# Patient Record
Sex: Female | Born: 1965 | Race: White | Hispanic: No
Health system: Southern US, Community
[De-identification: ages and names within clinical notes are randomized; demographics above are authoritative.]

---

## 2004-02-10 ENCOUNTER — Emergency Department: Payer: Self-pay | Admitting: General Practice

## 2004-11-03 ENCOUNTER — Emergency Department: Payer: Self-pay | Admitting: Emergency Medicine

## 2005-01-22 ENCOUNTER — Emergency Department: Payer: Self-pay | Admitting: Emergency Medicine

## 2005-04-29 ENCOUNTER — Emergency Department: Payer: Self-pay | Admitting: Emergency Medicine

## 2006-08-24 ENCOUNTER — Emergency Department: Payer: Self-pay | Admitting: Emergency Medicine

## 2011-01-27 ENCOUNTER — Ambulatory Visit: Payer: Self-pay | Admitting: Family Medicine

## 2011-12-30 ENCOUNTER — Emergency Department: Payer: Self-pay | Admitting: Emergency Medicine

## 2011-12-30 LAB — BASIC METABOLIC PANEL
Anion Gap: 8 (ref 7–16)
Calcium, Total: 9.2 mg/dL (ref 8.5–10.1)
Co2: 29 mmol/L (ref 21–32)
EGFR (African American): >60
Osmolality: 267 (ref 275–301)

## 2011-12-30 LAB — CBC
HCT: 42.4 % (ref 35.0–47.0)
MCHC: 35.1 g/dL (ref 32.0–36.0)
MCV: 90 fL (ref 80–100)
Platelet: 254 10*3/uL (ref 150–440)

## 2011-12-30 LAB — TROPONIN I: Troponin-I: <0.02 ng/mL

## 2014-07-06 ENCOUNTER — Other Ambulatory Visit: Payer: Self-pay | Admitting: Family Medicine

## 2014-07-06 DIAGNOSIS — Z1231 Encounter for screening mammogram for malignant neoplasm of breast: Secondary | ICD-10-CM

## 2014-07-20 ENCOUNTER — Ambulatory Visit
Admission: RE | Admit: 2014-07-20 | Discharge: 2014-07-20 | Disposition: A | Discharge status: Home / Self Care | Payer: Self-pay | Source: Ambulatory Visit | Attending: Family Medicine | Admitting: Family Medicine

## 2014-07-20 DIAGNOSIS — Z1231 Encounter for screening mammogram for malignant neoplasm of breast: Secondary | ICD-10-CM | POA: Insufficient documentation

## 2014-07-21 ENCOUNTER — Other Ambulatory Visit: Payer: Self-pay | Admitting: Family Medicine

## 2014-07-21 DIAGNOSIS — R928 Other abnormal and inconclusive findings on diagnostic imaging of breast: Secondary | ICD-10-CM

## 2014-08-03 ENCOUNTER — Ambulatory Visit
Admission: RE | Admit: 2014-08-03 | Discharge: 2014-08-03 | Disposition: A | Discharge status: Home / Self Care | Payer: Self-pay | Source: Ambulatory Visit | Attending: Family Medicine | Admitting: Family Medicine

## 2014-08-03 DIAGNOSIS — R928 Other abnormal and inconclusive findings on diagnostic imaging of breast: Secondary | ICD-10-CM

## 2014-08-03 DIAGNOSIS — N63 Unspecified lump in breast: Secondary | ICD-10-CM | POA: Insufficient documentation

## 2014-09-15 ENCOUNTER — Other Ambulatory Visit: Payer: Self-pay | Admitting: Family Medicine

## 2014-09-15 DIAGNOSIS — R590 Localized enlarged lymph nodes: Secondary | ICD-10-CM

## 2014-09-21 ENCOUNTER — Ambulatory Visit
Admission: RE | Admit: 2014-09-21 | Discharge: 2014-09-21 | Disposition: A | Discharge status: Home / Self Care | Payer: Self-pay | Source: Ambulatory Visit | Attending: Family Medicine | Admitting: Family Medicine

## 2014-09-21 ENCOUNTER — Other Ambulatory Visit: Payer: Self-pay | Admitting: Family Medicine

## 2014-09-21 DIAGNOSIS — R590 Localized enlarged lymph nodes: Secondary | ICD-10-CM

## 2015-08-27 ENCOUNTER — Ambulatory Visit: Payer: Self-pay | Attending: Internal Medicine | Admitting: *Deleted

## 2015-08-27 ENCOUNTER — Encounter: Payer: Self-pay | Admitting: *Deleted

## 2015-08-27 ENCOUNTER — Ambulatory Visit
Admission: RE | Admit: 2015-08-27 | Discharge: 2015-08-27 | Disposition: A | Discharge status: Home / Self Care | Payer: Self-pay | Source: Ambulatory Visit | Attending: Oncology | Admitting: Oncology

## 2015-08-27 ENCOUNTER — Other Ambulatory Visit: Payer: Self-pay | Admitting: *Deleted

## 2015-08-27 VITALS — BP 146/99 | HR 76 | Temp 98.7°F | Ht 63.39 in | Wt 161.5 lb

## 2015-08-27 DIAGNOSIS — N63 Unspecified lump in unspecified breast: Secondary | ICD-10-CM

## 2015-08-27 NOTE — Progress Notes (Signed)
Subjective:     Patient ID: Rhonda Arias, female   DOB: August 06, 1965, 50 y.o.   MRN: [id number]  HPI   Review of Systems     Objective:   Physical Exam  Pulmonary/Chest: Right breast exhibits no inverted nipple, no mass, no nipple discharge, no skin change and no tenderness. Left breast exhibits no inverted nipple, no mass, no nipple discharge and no skin change.         Assessment:     50 year old White female [facility] breast exam and [facility]ogram.  Last mammo was a birads 3.  Patient did not follow-up in 6 months.  States she can still feel the area of concern.  On clinical breast exam I can palpate a tender fibroglandular like area at 4:30-5:30 left breast.  Patient states this is the area of concern on the mammogram.  Blood pressure elevated at 146/99.  She is to recheck her blood pressure at [facility] or [facility], and if remains higher than 140/90 she is to follow-up with her primary care provider.  Hand out on hypertention given to patient. Patient has been screened for eligibility.  She does not have any insurance, Medicare or Medicaid.  She also meets financial eligibility.  Hand-out given on the Affordable Care Act.    Plan:     Bilateral diagnostic mammogram and ultrasound ordered.  Will follow-up per [facility] protocol.

## 2015-08-27 NOTE — Patient Instructions (Signed)
Hypertension Hypertension, commonly called high blood pressure, is when the force of blood pumping through your arteries is too strong. Your arteries are the blood vessels that carry blood from your heart throughout your body. A blood pressure reading consists of a higher number over a lower number, such as 110/72. The higher number (systolic) is the pressure inside your arteries when your heart pumps. The lower number (diastolic) is the pressure inside your arteries when your heart relaxes. Ideally you want your blood pressure below 120/80. Hypertension forces your heart to work harder to pump blood. Your arteries may become narrow or stiff. Having untreated or uncontrolled hypertension can cause heart attack, stroke, kidney disease, and other problems. RISK FACTORS Some risk factors for high blood pressure are controllable. Others are not.  Risk factors you cannot control include:   Race. You may be at higher risk if you are African American.  Age. Risk increases with age.  Gender. Men are at higher risk than women before age 45 years. After age 65, women are at higher risk than men. Risk factors you can control include:  Not getting enough exercise or physical activity.  Being overweight.  Getting too much fat, sugar, calories, or salt in your diet.  Drinking too much alcohol. SIGNS AND SYMPTOMS Hypertension does not usually cause signs or symptoms. Extremely high blood pressure (hypertensive crisis) may cause headache, anxiety, shortness of breath, and nosebleed. DIAGNOSIS To check if you have hypertension, your health care provider will measure your blood pressure while you are seated, with your arm held at the level of your heart. It should be measured at least twice using the same arm. Certain conditions can cause a difference in blood pressure between your right and left arms. A blood pressure reading that is higher than normal on one occasion does not mean that you need treatment. If  it is not clear whether you have high blood pressure, you may be asked to return on a different day to have your blood pressure checked again. Or, you may be asked to monitor your blood pressure at home for 1 or more weeks. TREATMENT Treating high blood pressure includes making lifestyle changes and possibly taking medicine. Living a healthy lifestyle can help lower high blood pressure. You may need to change some of your habits. Lifestyle changes may include:  Following the DASH diet. This diet is high in fruits, vegetables, and whole grains. It is low in salt, red meat, and added sugars.  Keep your sodium intake below 2,300 mg per day.  Getting at least 30-45 minutes of aerobic exercise at least 4 times per week.  Losing weight if necessary.  Not smoking.  Limiting alcoholic beverages.  Learning ways to reduce stress. Your health care provider may prescribe medicine if lifestyle changes are not enough to get your blood pressure under control, and if one of the following is true:  You are 18-59 years of age and your systolic blood pressure is above 140.  You are 60 years of age or older, and your systolic blood pressure is above 150.  Your diastolic blood pressure is above 90.  You have diabetes, and your systolic blood pressure is over 140 or your diastolic blood pressure is over 90.  You have kidney disease and your blood pressure is above 140/90.  You have heart disease and your blood pressure is above 140/90. Your personal target blood pressure may vary depending on your medical conditions, your age, and other factors. HOME CARE INSTRUCTIONS    Have your blood pressure rechecked as directed by your health care provider.   Take medicines only as directed by your health care provider. Follow the directions carefully. Blood pressure medicines must be taken as prescribed. The medicine does not work as well when you skip doses. Skipping doses also puts you at risk for  problems.  Do not smoke.   Monitor your blood pressure at home as directed by your health care provider. SEEK MEDICAL CARE IF:   You think you are having a reaction to medicines taken.  You have recurrent headaches or feel dizzy.  You have swelling in your ankles.  You have trouble with your vision. SEEK IMMEDIATE MEDICAL CARE IF:  You develop a severe headache or confusion.  You have unusual weakness, numbness, or feel faint.  You have severe chest or abdominal pain.  You vomit repeatedly.  You have trouble breathing. MAKE SURE YOU:   Understand these instructions.  Will watch your condition.  Will get help right away if you are not doing well or get worse.   This information is not intended to replace advice given to you by your health care provider. Make sure you discuss any questions you have with your health care provider.   Document Released: 01/27/2005 Document Revised: 06/13/2014 Document Reviewed: 11/19/2012 Elsevier Interactive Patient Education 2016 [employer]   Gave patient hand-out, Women Staying Healthy, Active and Well [facility], with education on breast health, pap smears, heart and colon health.

## 2015-08-28 ENCOUNTER — Encounter: Payer: Self-pay | Admitting: *Deleted

## 2015-08-28 NOTE — Progress Notes (Signed)
Mailed patient a letter with information regarding her mammogram results and need for next mammo and ultrasound in one year.  Appointment scheduled for 09/01/16 @ 8:00.  [facility] [place].

## 2016-07-01 IMAGING — MG MM DIGITAL SCREENING BILAT W/ CAD
4 series · 4 of 4 positions shown · non-contrast
Comparison: Previous exam(s).

CLINICAL DATA: Screening.

EXAM:
DIGITAL SCREENING BILATERAL MAMMOGRAM WITH CAD

[R MLO]
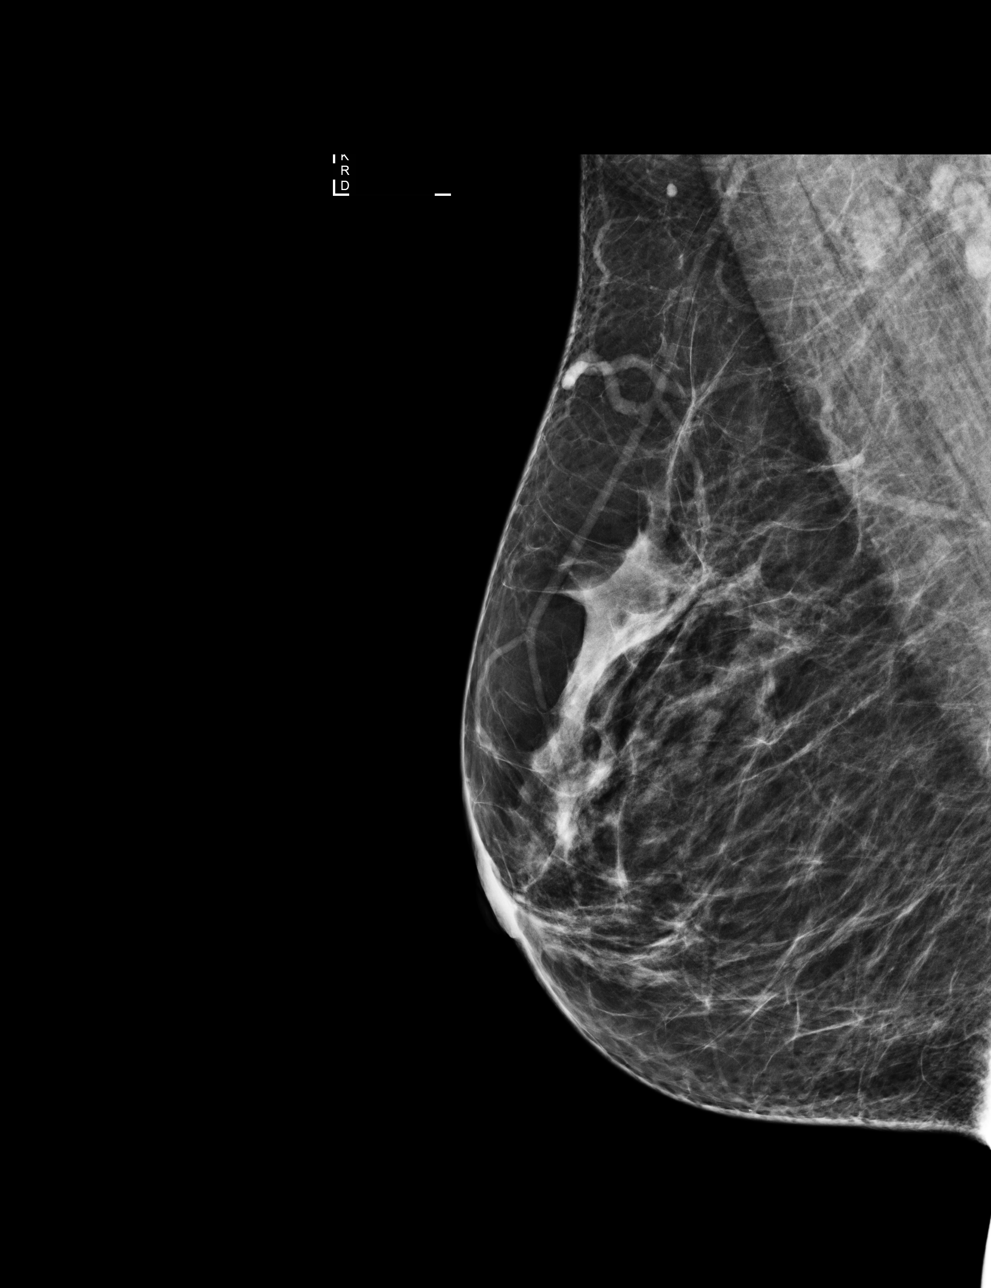

[L MLO]
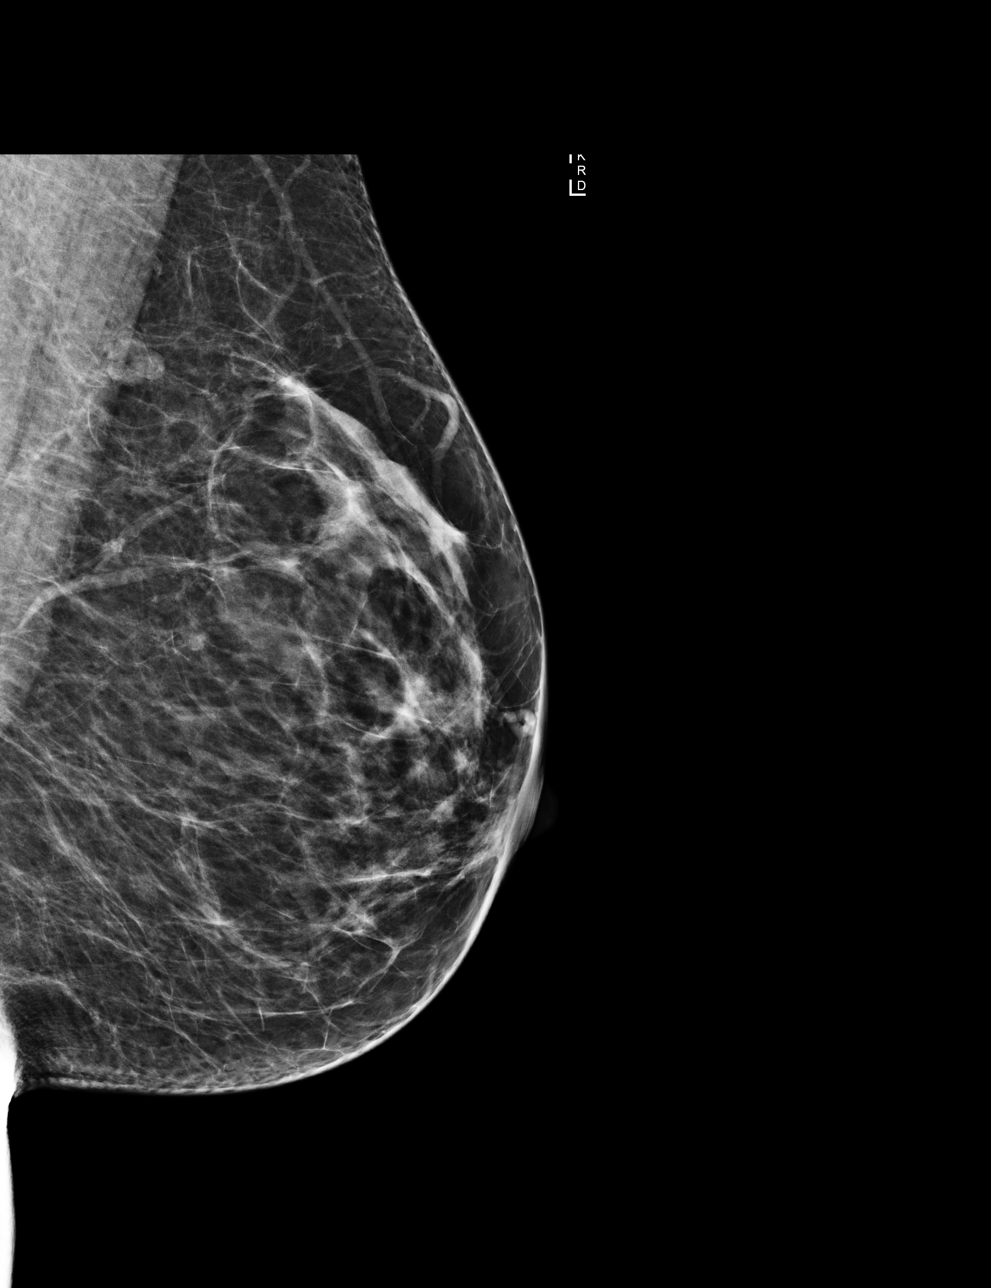

[L CC]
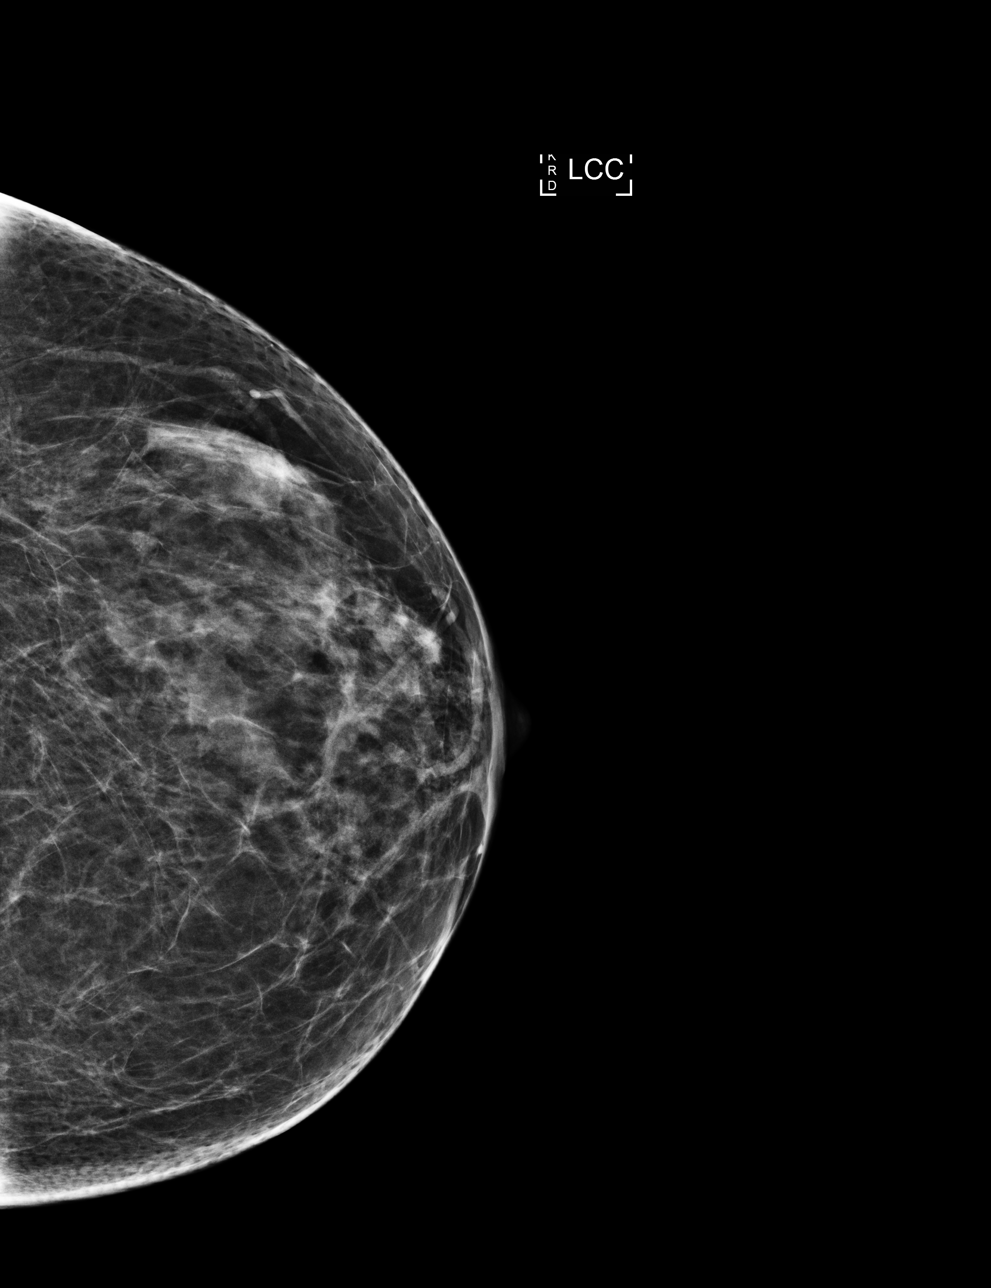

[R CC]
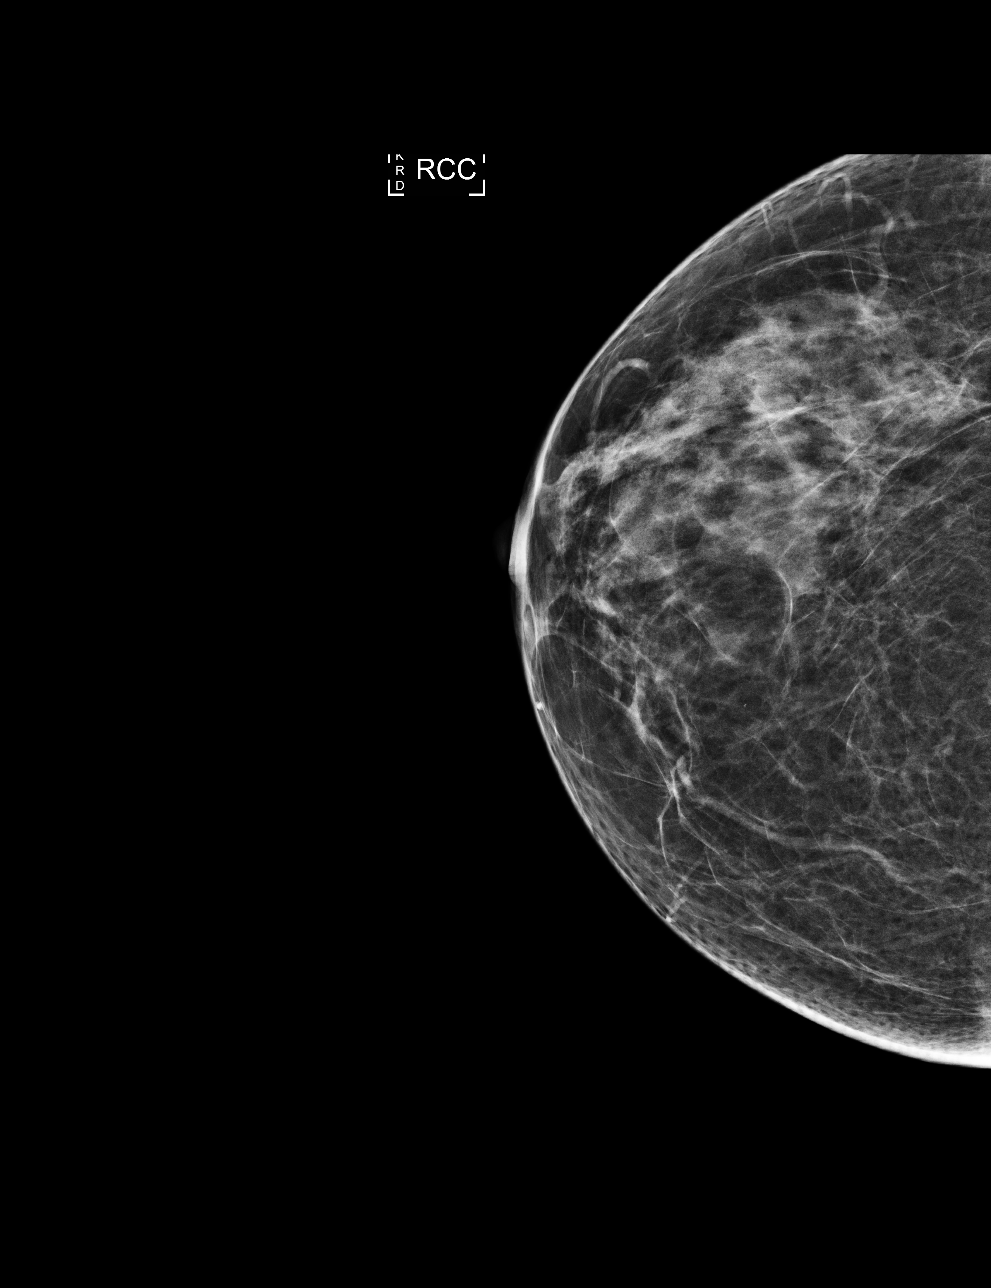

[4 of 4 positions shown; findings below may reference images not displayed]

ACR Breast Density Category c: The breast tissue is heterogeneously
dense, which may obscure small masses.
FINDINGS: In the left breast, a possible mass warrants further evaluation. In
the right breast, no findings suspicious for malignancy.

Images were processed with CAD.
IMPRESSION: Further evaluation is suggested for possible mass in the left
breast.

RECOMMENDATION:
Diagnostic mammogram and possibly ultrasound of the left breast.
(Code:[id number]-[id number])

The patient will be contacted regarding the findings, and additional
imaging will be scheduled.

BI-RADS CATEGORY  0: Incomplete. Need additional imaging evaluation
and/or prior mammograms for comparison.

## 2016-09-01 ENCOUNTER — Ambulatory Visit
Admission: RE | Admit: 2016-09-01 | Discharge: 2016-09-01 | Disposition: A | Discharge status: Home / Self Care | Payer: Self-pay | Source: Ambulatory Visit | Attending: Oncology | Admitting: Oncology

## 2016-09-01 ENCOUNTER — Encounter (INDEPENDENT_AMBULATORY_CARE_PROVIDER_SITE_OTHER): Payer: Self-pay

## 2016-09-01 ENCOUNTER — Ambulatory Visit: Payer: Self-pay | Attending: Oncology

## 2016-09-01 VITALS — BP 124/83 | HR 68 | Temp 98.5°F | Ht 62.0 in | Wt 157.0 lb

## 2016-09-01 DIAGNOSIS — N63 Unspecified lump in unspecified breast: Secondary | ICD-10-CM

## 2016-09-01 NOTE — Progress Notes (Signed)
Radiologist gave benign mammogram results to patient, and recommendation for annual mammogram.  Copy to [facility].

## 2016-09-01 NOTE — Progress Notes (Signed)
Subjective:     Patient ID: Rhonda Arias, female   DOB: [birth year], 51 y.o.   MRN: [id number]  HPI   Review of Systems     Objective:   Physical Exam  Pulmonary/Chest: Right breast exhibits no inverted nipple, no mass, no nipple discharge, no skin change and no tenderness. Left breast exhibits no inverted nipple, no mass, no nipple discharge, no skin change and no tenderness. Breasts are symmetrical.       Assessment:     51 year old patient with a history of 4mm left breast mass, and Birads 3 mammogram from 08/27/15, presents for annual [facility] screening.  Patient screened, and meets [facility] eligibility.  Patient does not have insurance, Medicare or Medicaid.  Handout given on Affordable Care Act. Instructed patient on breast self-exam using teach back method.  CBE unremarkable.  No mass or lump palpated.    Plan:     Sent for bilateral diagnostic mammogram, and ultrasound per Radiologist request.

## 2018-06-09 ENCOUNTER — Ambulatory Visit: Payer: Self-pay

## 2019-07-26 ENCOUNTER — Ambulatory Visit
Admission: RE | Admit: 2019-07-26 | Discharge: 2019-07-26 | Disposition: A | Discharge status: Home / Self Care | Payer: Medicaid Other | Source: Ambulatory Visit | Attending: Oncology | Admitting: Oncology

## 2019-07-26 ENCOUNTER — Ambulatory Visit: Payer: Self-pay | Attending: Oncology

## 2019-07-26 ENCOUNTER — Other Ambulatory Visit: Payer: Self-pay

## 2019-07-26 VITALS — BP 106/76 | HR 64 | Temp 97.5°F | Resp 20 | Wt 132.4 lb

## 2019-07-26 DIAGNOSIS — Z0000 Encounter for general adult medical examination without abnormal findings: Secondary | ICD-10-CM

## 2019-07-26 NOTE — Progress Notes (Signed)
  Subjective:     Patient ID: Rhonda Arias, female   DOB: 11-13-1965, 54 y.o.   MRN: [id number]  HPI   Review of Systems     Objective:   Physical Exam Chest:     Breasts:        Right: No swelling, bleeding, inverted nipple, mass, nipple discharge, skin change or tenderness.        Left: No swelling, bleeding, inverted nipple, mass, nipple discharge, skin change or tenderness.  Genitourinary:    Labia:        Right: No rash, tenderness, lesion or injury.        Left: No rash, tenderness, lesion or injury.      Vagina: No signs of injury and foreign body. No vaginal discharge, erythema, tenderness, bleeding, lesions or prolapsed vaginal walls.     Cervix: No cervical motion tenderness, discharge, friability, lesion, erythema, cervical bleeding or eversion.     Uterus: Deviated. Not enlarged, not fixed, not tender and no uterine prolapse.      Adnexa:        Right: No mass, tenderness or fullness.         Left: No mass, tenderness or fullness.       Comments: Uterus deviates to patient's right.         Assessment:     54 year old patient returns for annual [facility] screening.  She was followed for left breast asymmetry, but last mammogram in 2018 resulted in a Birads 2. Patient screened, and meets [facility] eligibility.  Patient does not have insurance, Medicare or Medicaid. Instructed patient on breast self awareness using teach back method.  Clinical breast exam unremarkable.  No mass or lump palpated.  Pelvic exam normal.     Risk Assessment    Risk Scores      07/26/2019   Last edited by: Alta Corning, CMA   5-year risk: 0.8 %   Lifetime risk: 6.1 %         Plan:     Sent for bilateral screening mammogram. Specimen collected for pap.

## 2019-07-27 ENCOUNTER — Other Ambulatory Visit: Payer: Self-pay

## 2019-07-27 DIAGNOSIS — N63 Unspecified lump in unspecified breast: Secondary | ICD-10-CM

## 2019-07-29 LAB — IGP, APTIMA HPV: HPV Aptima: NEGATIVE

## 2019-08-02 NOTE — Progress Notes (Signed)
Phoned patient with normal pap results. Next pap due in 5 years.  She is scheduled for additional view mammogram on 08/08/19.

## 2019-08-08 ENCOUNTER — Ambulatory Visit
Admission: RE | Admit: 2019-08-08 | Discharge: 2019-08-08 | Disposition: A | Discharge status: Home / Self Care | Payer: Medicaid Other | Source: Ambulatory Visit | Attending: Oncology | Admitting: Oncology

## 2019-08-08 DIAGNOSIS — N63 Unspecified lump in unspecified breast: Secondary | ICD-10-CM | POA: Insufficient documentation

## 2019-08-09 NOTE — Progress Notes (Signed)
Radiologist discussed Birads 2 mammogram results with patient.  To return for annual screening in one year. Copy to [facility].

## 2019-10-17 ENCOUNTER — Other Ambulatory Visit: Payer: Self-pay

## 2019-10-17 ENCOUNTER — Telehealth: Payer: Self-pay | Admitting: Emergency Medicine

## 2019-10-17 ENCOUNTER — Emergency Department
Admission: EM | Admit: 2019-10-17 | Discharge: 2019-10-17 | Disposition: A | Discharge status: Home / Self Care | Payer: Medicaid Other | Attending: Emergency Medicine | Admitting: Emergency Medicine

## 2019-10-17 DIAGNOSIS — R55 Syncope and collapse: Secondary | ICD-10-CM | POA: Insufficient documentation

## 2019-10-17 DIAGNOSIS — Z5321 Procedure and treatment not carried out due to patient leaving prior to being seen by health care provider: Secondary | ICD-10-CM | POA: Insufficient documentation

## 2019-10-17 LAB — BASIC METABOLIC PANEL
Anion gap: 11 (ref 5–15)
BUN: 6 mg/dL (ref 6–20)
CO2: 28 mmol/L (ref 22–32)
Calcium: 9.3 mg/dL (ref 8.9–10.3)
Chloride: 84 mmol/L — ABNORMAL LOW (ref 98–111)
Creatinine, Ser: 0.65 mg/dL (ref 0.44–1.00)
GFR calc Af Amer: >60 mL/min (ref >60)
GFR calc non Af Amer: >60 mL/min (ref >60)
Glucose, Bld: 125 mg/dL — ABNORMAL HIGH (ref 70–99)
Potassium: 2.8 mmol/L — ABNORMAL LOW (ref 3.5–5.1)
Sodium: 123 mmol/L — ABNORMAL LOW (ref 135–145)

## 2019-10-17 LAB — CBC
HCT: 36.6 % (ref 36.0–46.0)
Hemoglobin: 13.5 g/dL (ref 12.0–15.0)
MCH: 32.1 pg (ref 26.0–34.0)
MCHC: 36.9 g/dL — ABNORMAL HIGH (ref 30.0–36.0)
MCV: 87.1 fL (ref 80.0–100.0)
Platelets: 325 10*3/uL (ref 150–400)
RBC: 4.2 MIL/uL (ref 3.87–5.11)
RDW: 12.5 % (ref 11.5–15.5)
WBC: 8.9 10*3/uL (ref 4.0–10.5)
nRBC: 0 % (ref 0.0–0.2)

## 2019-10-17 NOTE — ED Triage Notes (Signed)
FIRST NURSE NOTE:  Pt arrived via [facility] from home, reports the pt was asleep and that her daughter lives next door who has been in [place] due to covid sxs and has not seen the patient in a week.  Daughter asked pt to meet outside and pt was found to have a blank stare, concerned about low BP as well, but pt has hx of HTN. Pt did report to etoh use around 7pm today.

## 2019-10-17 NOTE — ED Notes (Addendum)
Pt informed registration that she was leaving and had an follow up appointment this week. This RN did not see patient leave.

## 2019-10-17 NOTE — ED Triage Notes (Signed)
Patient reports that her daughter called her while she was sleeping.  States they had been staying away from each other because daughter had been sick.  Patient reports going to the bathroom she didn't feel good.  Reports she went outside and her head didn't feel right and she was weak.

## 2019-10-17 NOTE — Telephone Encounter (Signed)
Called patient due to lwot to inquire about condition and follow up plans.  Says she has checked her bp and it is better today.  She plans to wait until her pcp opens tomorrow.  I told her that potassium was low.  She will ask her pcp about this tomorrow, as she is already taking potassium.  I told her that if she felt worse before tomorrow, she should return here.

## 2022-01-10 DIAGNOSIS — Z419 Encounter for procedure for purposes other than remedying health state, unspecified: Secondary | ICD-10-CM | POA: Diagnosis not present

## 2022-02-10 DIAGNOSIS — Z419 Encounter for procedure for purposes other than remedying health state, unspecified: Secondary | ICD-10-CM | POA: Diagnosis not present

## 2022-03-13 DIAGNOSIS — Z419 Encounter for procedure for purposes other than remedying health state, unspecified: Secondary | ICD-10-CM | POA: Diagnosis not present

## 2022-04-24 DIAGNOSIS — I10 Essential (primary) hypertension: Secondary | ICD-10-CM | POA: Diagnosis not present

## 2023-04-24 ENCOUNTER — Other Ambulatory Visit: Payer: Self-pay | Admitting: Family Medicine

## 2023-04-24 DIAGNOSIS — Z1231 Encounter for screening mammogram for malignant neoplasm of breast: Secondary | ICD-10-CM

## 2023-05-14 ENCOUNTER — Ambulatory Visit
Admission: RE | Admit: 2023-05-14 | Discharge: 2023-05-14 | Disposition: A | Discharge status: Home / Self Care | Source: Ambulatory Visit | Attending: Family Medicine | Admitting: Family Medicine

## 2023-05-14 DIAGNOSIS — Z1231 Encounter for screening mammogram for malignant neoplasm of breast: Secondary | ICD-10-CM | POA: Diagnosis present
